# Patient Record
Sex: Female | Born: 1960 | Race: White | Hispanic: No | Marital: Married | Smoking: Never smoker
Health system: Southern US, Community
[De-identification: ages and names within clinical notes are randomized; demographics above are authoritative.]

## PROBLEM LIST (undated history)

## (undated) DIAGNOSIS — E669 Obesity, unspecified: Secondary | ICD-10-CM

## (undated) HISTORY — DX: Obesity, unspecified: E66.9

---

## 2003-09-11 HISTORY — PX: LAPAROSCOPIC VAGINAL HYSTERECTOMY WITH SALPINGO OOPHORECTOMY: SHX6681

## 2019-03-04 DIAGNOSIS — N905 Atrophy of vulva: Secondary | ICD-10-CM | POA: Insufficient documentation

## 2020-10-03 NOTE — Progress Notes (Signed)
    SUBJECTIVE:   CHIEF COMPLAINT / HPI:   60 yo female who presents to establish care. Previous Family Doctor: Langston Masker in [place], last seen 05/12/2020  Concerns: Chronic low back pain- been present for over two years, no inciting incident, feels more stiff in the mornings and when she lays down, but does not wake her from sleep. Played sports growing up but no history of trauma, used to lift a lot of heavy boxes. Denies fevers, chills, numbness, weakness, saddle paresthesia, bowel or bladder incontinence. Denies history of IV drug use. No radiation or symptoms of sciatica. More on right side sometimes, wondering if related to hips. No history of fractures or surgery on her back.  PMH: Chronic low back pain as above  Home Medications: None  Allergies: NKDA  Surgical Hx: Hysterectomy with bilateral oophorectomy- 2005 for abnormal bleeding, previously on Lupron, no history of cancer  Social Hx: Home: lives with husband Maisie Fus, and her dog! Children in area, babysits! Tobacco: Never smoker Alcohol use: doesn't use Other substance use: denies current or previous history of IV drug use or other substance use.  HCM Colonoscopy- 2018 Mammogram- 2021 Pap smear- 2021 Shingles vaccine- 2021 (records request signed today to verify)  OBJECTIVE:   BP 130/78   Pulse 65   Ht 5' 5.5" (1.664 m)   Wt 222 lb (100.7 kg)   SpO2 99%   BMI 36.38 kg/m   General: A&O, NAD HEENT: No sign of trauma, EOM grossly intact Cardiac: RRR, no m/r/g Respiratory: CTAB, normal WOB, no w/c/r GI: Soft, NTTP, non-distended  Extremities: NTTP, no peripheral edema. Spine: Lumbar and thoracic spine non-tender to palpation. No induration, erythema, or fluctuance appreciated along spine, no paraspinal musculature tenderness to palpation. No significant curvature or scoliosis appreciated. No skin lesions or rashes appreciated. MSK: negative straight leg raise bilaterally, negative FABER bilaterally. No tenderness  to palpation of bilateral hips, no pain with internal/external rotation of hips Neuro: Normal gait, moves all four extremities appropriately. Strength 5/5 in bilateral upper and lower extremities, sensation to light touch intact in bilateral upper and lower extremities Psych: Appropriate mood and affect   ASSESSMENT/PLAN:   Chronic low back pain - Lumbar X-ray ordered due to chronicity of complaint, received report that " Significant disc space narrowing at T11/T12, with prominent endplate sclerosis and significant anterior osteophyte formation. While the appearance could reflect an isolated level of significant spondylosis or sequela of previous trauma, please correlate with any clinical evidence of underlying infection. Further evaluation with MRI may be useful", also showing L4-L5 spondylosis - discussed with radiology, patient with normal neuro exam, no signs of infection/induration and no fevers or chills or recent worsening of symptoms, no history of IV drug use,  thus my suspicion for infectious cause very low, discussed with Ms Vermeer and decided with shared decision making to get MRI, radiology recommended without contrast - Have placed order, in mean time discussed strict return precautions with Ms Creelman including fevers, chills, worsening back pain, numbness or weakness - PRN Voltaren gel for pain control, referral to physical therapy   Called and discussed date/time for MRI appt on 10/10/20 with patient, answered all questions and concerns. Confirmed no indwelling device or known metal in body.  Billey Co, MD [facility]

## 2020-10-05 ENCOUNTER — Ambulatory Visit: Payer: 59 | Admitting: Family Medicine

## 2020-10-05 ENCOUNTER — Ambulatory Visit
Admission: RE | Admit: 2020-10-05 | Discharge: 2020-10-05 | Disposition: A | Discharge status: Home / Self Care | Payer: 59 | Source: Ambulatory Visit | Attending: Family Medicine | Admitting: Family Medicine

## 2020-10-05 ENCOUNTER — Other Ambulatory Visit: Payer: Self-pay

## 2020-10-05 ENCOUNTER — Encounter: Payer: Self-pay | Admitting: Family Medicine

## 2020-10-05 VITALS — BP 130/78 | HR 65 | Ht 65.5 in | Wt 222.0 lb

## 2020-10-05 DIAGNOSIS — M549 Dorsalgia, unspecified: Secondary | ICD-10-CM

## 2020-10-05 DIAGNOSIS — G8929 Other chronic pain: Secondary | ICD-10-CM

## 2020-10-05 DIAGNOSIS — M545 Low back pain, unspecified: Secondary | ICD-10-CM | POA: Diagnosis not present

## 2020-10-05 MED ORDER — DICLOFENAC SODIUM 1 % EX GEL: 4.0000 g | CUTANEOUS | 1 refills | Status: DC | PRN

## 2020-10-05 NOTE — Patient Instructions (Addendum)
It was wonderful to see you today.  Please bring ALL of your medications with you to every visit.   Today we talked about:  - Records requested from your previous doctor's office  - X-ray of your back today, you can go to [facility] either location for this and referral to physical therapy - Voltaren gel to your pharmacy  Thank you for choosing [facility].   Please call [phone] with any questions about today's appointment.  Please be sure to schedule follow up at the front  desk before you leave today.   Burley Saver, MD  Family Medicine

## 2020-10-06 ENCOUNTER — Encounter: Payer: Self-pay | Admitting: Family Medicine

## 2020-10-06 NOTE — Assessment & Plan Note (Addendum)
-   Lumbar X-ray ordered due to chronicity of complaint, received report that " Significant disc space narrowing at T11/T12, with prominent endplate sclerosis and significant anterior osteophyte formation. While the appearance could reflect an isolated level of significant spondylosis or sequela of previous trauma, please correlate with any clinical evidence of underlying infection. Further evaluation with MRI may be useful", also showing L4-L5 spondylosis - discussed with radiology, patient with normal neuro exam, no signs of infection/induration and no fevers or chills or recent worsening of symptoms, no history of IV drug use,  thus my suspicion for infectious cause very low, discussed with Kathleen Evans and decided with shared decision making to get MRI, radiology recommended without contrast - Have placed order, in mean time discussed strict return precautions with Kathleen Evans including fevers, chills, worsening back pain, numbness or weakness - PRN Voltaren gel for pain control, referral to physical therapy

## 2020-10-10 ENCOUNTER — Telehealth: Payer: Self-pay | Admitting: Family Medicine

## 2020-10-10 ENCOUNTER — Other Ambulatory Visit: Payer: Self-pay

## 2020-10-10 ENCOUNTER — Ambulatory Visit (HOSPITAL_COMMUNITY)
Admission: RE | Admit: 2020-10-10 | Discharge: 2020-10-10 | Disposition: A | Discharge status: Home / Self Care | Payer: 59 | Source: Ambulatory Visit | Attending: Family Medicine | Admitting: Family Medicine

## 2020-10-10 DIAGNOSIS — G8929 Other chronic pain: Secondary | ICD-10-CM

## 2020-10-10 DIAGNOSIS — M549 Dorsalgia, unspecified: Secondary | ICD-10-CM | POA: Diagnosis not present

## 2020-10-10 NOTE — Telephone Encounter (Signed)
Called and discussed MRI results with Kathleen Evans. Discussed chronic degenerative changes at T11-T12 without signs of acute infection. Discussed mild canal stenosis at L3-L4 and L4-L5. Discussed that disc at L4-L5 may contact the exited right L4 nerve root. Patient not having radicular symptoms at this time. Discussed that physical therapy remains first line for treatment, and can also use PRN tylenol and voltaren gel that has already been sent in. Referral to PT sent at this time. Answered all patient questions and concerns.  Burley Saver MD

## 2020-10-24 ENCOUNTER — Other Ambulatory Visit: Payer: Self-pay

## 2020-10-24 ENCOUNTER — Ambulatory Visit: Payer: 59 | Attending: Family Medicine

## 2020-10-24 DIAGNOSIS — G8929 Other chronic pain: Secondary | ICD-10-CM | POA: Diagnosis present

## 2020-10-24 DIAGNOSIS — M6281 Muscle weakness (generalized): Secondary | ICD-10-CM | POA: Diagnosis not present

## 2020-10-24 DIAGNOSIS — M545 Low back pain, unspecified: Secondary | ICD-10-CM | POA: Insufficient documentation

## 2020-10-24 DIAGNOSIS — M25652 Stiffness of left hip, not elsewhere classified: Secondary | ICD-10-CM | POA: Insufficient documentation

## 2020-10-24 NOTE — Therapy (Signed)
[facility] [address] [place], [place], [address] Phone: [phone]   Fax:  [phone]  Physical Therapy Evaluation  Patient Details  Name: Kathleen Evans MRN: [id number] Date of Birth: 11-Mar-1961 Referring Provider (PT): Pray   Encounter Date: 10/24/2020   PT End of Session - 10/24/20 1222    Visit Number 1    Number of Visits 17    Date for PT Re-Evaluation 12/19/20    PT Start Time 1015    PT Stop Time 1100    PT Time Calculation (min) 45 min    Activity Tolerance Patient tolerated treatment well    Behavior During Therapy [facility] for tasks assessed/performed           Past Medical History:  Diagnosis Date  . Obesity (BMI 35.0-39.9 without comorbidity)     Past Surgical History:  Procedure Laterality Date  . LAPAROSCOPIC VAGINAL HYSTERECTOMY WITH SALPINGO OOPHORECTOMY  2005    There were no vitals filed for this visit.    Subjective Assessment - 10/24/20 1016    Subjective Pt report LBP started slowly over time. Recently moved here from [place] where she used to do alot of activity. She has been feeling tight pain into left lower back more than right. Sometimes it bothers to lay on the left side, hard to get comfortable.    Limitations Sitting;Walking;Standing   completes with pain, alters way she is moving because of pain   How long can you stand comfortably? >1 hr but will tolerate discomfort    Patient Stated Goals to get stronger, to decrease pain    Currently in Pain? Yes    Pain Score 4    At worst 6-7/10   Pain Location Back    Pain Orientation Left    Pain Type Chronic pain    Pain Radiating Towards left buttocks    Pain Onset More than a month ago    Pain Frequency Constant    Aggravating Factors  worsened with standing up    Pain Relieving Factors tylenol, voltaren gel              [facility] PT Assessment - 10/24/20 1025      Assessment   Medical Diagnosis Chronic LBP     Referring Provider (PT) Pray    Hand Dominance Right    Next MD Visit Not yet    Prior Therapy None      Balance Screen   Has the patient fallen in the past 6 months No    Has the patient had a decrease in activity level because of a fear of falling?  No    Is the patient reluctant to leave their home because of a fear of falling?  No      Home Environment   Additional Comments 2 steps to get in, no issue. 1 level house.      Prior Function   Vocation Retired    [employer] work    Leisure Standing, walking, exercise with family at home      Cognition   Overall Cognitive Status Within Functional Limits for tasks assessed      AROM   AROM Assessment Site Lumbar    Lumbar Flexion 75%    Lumbar Extension 100%, no incr pain, "felt good"    Lumbar - Right Side Bend to lateral knee joint line    Lumbar - Left Side Bend Slightly above lateral knee joint line  Lumbar - Right Rotation 100%, + cramping LBP    Lumbar - Left Rotation 100%, + cramping LBP      Strength   Strength Assessment Site Hip;Knee;Ankle    Right/Left Hip Right;Left    Right Hip Flexion 4+/5    Right Hip Extension 4/5    Right Hip External Rotation  4+/5    Right Hip Internal Rotation 4/5    Right Hip ABduction 4+/5    Right Hip ADduction 4+/5    Left Hip Flexion 4/5    Left Hip Extension 4/5    Left Hip External Rotation 4-/5    Left Hip Internal Rotation 4-/5    Left Hip ABduction 4+/5    Left Hip ADduction 4+/5    Right/Left Knee Right;Left    Right Knee Flexion 4+/5    Right Knee Extension 4+/5    Left Knee Flexion 4/5    Left Knee Extension 4/5      Flexibility   Soft Tissue Assessment /Muscle Length yes    Hamstrings Mild tightness (L>R)    Piriformis mild tightness (L>R)      Palpation   Palpation comment TTP/mild spasm left  lumbar paraspinals, lower QL, SIJ and glute      Special Tests   Other special tests SLR negative Bilateral      Transfers   Five time sit to  stand comments  6 seconds- WFL for age. Noted quad bias with some functional glute weakness.      Ambulation/Gait   Gait Pattern Decreased stride length                      Objective measurements completed on examination: See above findings.               PT Education - 10/24/20 1222    Education Details Pt POC, Initial HEP. Access Code: [email or web address] . Seated Piriformis Stretch with Trunk Bend - 1 x daily - 5 x weekly - 4 sets - 30 seconds hold  Seated Hamstring Stretch - 1 x daily - 5 x weekly - 4 sets - 30 seconds hold  Supine Lower Trunk Rotation - 1 x daily - 5 x weekly - 2 sets - 10 reps  Supine March with Posterior Pelvic Tilt - 1 x daily - 5 x weekly - 2 sets - 10 reps  Supine Bridge - 1 x daily - 7 x weekly - 3 sets - 10 reps    Person(s) Educated Patient    Methods Demonstration;Explanation;Handout    Comprehension Verbalized understanding;Returned demonstration            PT Short Term Goals - 10/24/20 1231      PT SHORT TERM GOAL #1   Title Independent with initial HEP    Time 2    Period Weeks    Status New    Target Date 11/07/20             PT Long Term Goals - 10/24/20 1231      PT LONG TERM GOAL #1   Title Pt will report LBP </=2/10 with ADLs and walking.    Time 8    Period Weeks    Status New    Target Date 12/19/20      PT LONG TERM GOAL #2   Title Pt will demo gross BLE strength to at least 4+/5    Time 8    Period Weeks    Status New  Target Date 12/19/20      PT LONG TERM GOAL #3   Title Pt will demo full and symmetrical thoracolumbar rotation ROM without pain    Time 8    Period Weeks    Status New    Target Date 12/19/20                  Plan - 10/24/20 1223    Clinical Impression Statement Pt is a kind 60 yo female who presents with history of low back pain into left gluteal region. Pt had MRI of lumbar spine recently which noted Chronic degenerative changes at T11-T12 without signs of acute infection.  mild canal stenosis at L3-L4 and L4-L5. Pt presents with low back pain,  decreased lumbar and LE flexibility, and muscle weakness. She will benefit from skilled physical therapy to decrease pain, improve functional mobility and strength.    Personal Factors and Comorbidities Age;Past/Current Experience;Time since onset of injury/illness/exacerbation    Examination-Activity Limitations Locomotion Level    Examination-Participation Restrictions Community Activity;Interpersonal Relationship    Stability/Clinical Decision Making Stable/Uncomplicated    Clinical Decision Making Low    Rehab Potential Good    PT Frequency 2x / week    PT Duration 6 weeks   6-8 weeks   PT Treatment/Interventions ADLs/Self Care Home Management;Cryotherapy;Electrical Stimulation;Iontophoresis 4mg /ml Dexamethasone;Moist Heat;Traction;Gait training;Neuromuscular re-education;Balance training;Therapeutic exercise;Functional mobility training;Therapeutic activities;Manual techniques;Energy conservation;Taping    PT Next Visit Plan Follow up regarding HEP carryver and tolerance next visit. Lumbar and hip mobility. Progressive LE and proximal core strengthening as tolerated.    PT Home Exercise Plan see pt education.    Consulted and Agree with Plan of Care Patient           Patient will benefit from skilled therapeutic intervention in order to improve the following deficits and impairments:  Decreased range of motion,Postural dysfunction,Decreased strength,Decreased mobility,Pain,Improper body mechanics,Impaired flexibility  Visit Diagnosis: Muscle weakness (generalized) - Plan: PT plan of care cert/re-cert  Chronic low back pain, unspecified back pain laterality, unspecified whether sciatica present - Plan: PT plan of care cert/re-cert  Stiffness of left hip, not elsewhere classified - Plan: PT plan of care cert/re-cert     Problem List Patient Active Problem List   Diagnosis Date Noted  . Chronic low back pain  10/05/2020    10/07/2020, PT, DPT 10/24/2020, 12:38 PM  [facility] [address] [place], [place], [place] Phone: [phone]   Fax:  [phone]  Name: Cynde A. Bhola MRN: Judeth Cornfield Date of Birth: 1960/12/06

## 2020-10-31 ENCOUNTER — Other Ambulatory Visit: Payer: Self-pay

## 2020-10-31 ENCOUNTER — Ambulatory Visit: Payer: 59

## 2020-10-31 DIAGNOSIS — M6281 Muscle weakness (generalized): Secondary | ICD-10-CM

## 2020-10-31 DIAGNOSIS — G8929 Other chronic pain: Secondary | ICD-10-CM

## 2020-10-31 DIAGNOSIS — M25652 Stiffness of left hip, not elsewhere classified: Secondary | ICD-10-CM

## 2020-10-31 DIAGNOSIS — M545 Low back pain, unspecified: Secondary | ICD-10-CM

## 2020-10-31 NOTE — Therapy (Signed)
[facility] [address] [place], [place], [address] Phone: [phone]   Fax:  [phone]  Physical Therapy Treatment  Patient Details  Name: Kathleen Evans MRN: [id number] Date of Birth: 02-22-1961 Referring Provider (PT): Pray   Encounter Date: 10/31/2020   PT End of Session - 10/31/20 0939    Visit Number 2    Number of Visits 17    Date for PT Re-Evaluation 12/19/20    PT Start Time 0845    PT Stop Time 0930    PT Time Calculation (min) 45 min    Activity Tolerance Patient tolerated treatment well    Behavior During Therapy [facility] for tasks assessed/performed           Past Medical History:  Diagnosis Date  . Obesity (BMI 35.0-39.9 without comorbidity)     Past Surgical History:  Procedure Laterality Date  . LAPAROSCOPIC VAGINAL HYSTERECTOMY WITH SALPINGO OOPHORECTOMY  2005    There were no vitals filed for this visit.   Subjective Assessment - 10/31/20 0903    Subjective Pt reports not feeling as siff since starting exercises at home    Limitations Sitting;Walking;Standing    How long can you stand comfortably? >1 hr but will tolerate discomfort    Patient Stated Goals to get stronger, to decrease pain    Currently in Pain? No/denies    Pain Score 0-No pain                             [facility] Adult PT Treatment/Exercise - 10/31/20 0850      Exercises   Exercises Lumbar;Knee/Hip;Ankle      Lumbar Exercises: Stretches   Active Hamstring Stretch 1 rep;20 seconds    Single Knee to Chest Stretch 2 reps;10 seconds;Left;Right    Lower Trunk Rotation --   10 x on mat, 10x with pball   ITB Stretch 2 reps;Left;20 seconds    Piriformis Stretch  Lumbar 3 way stretch 2 reps;20 seconds   Blue pball x 10 each direction     Lumbar Exercises: Standing   Other Standing Lumbar Exercises Hip hinge/modified dead lift with 10# DB x 10, cues to prevent knee hyperext and for hip hinge.     Other Standing Lumbar Exercises hip hinge/cable pull throughs 15# x 10      Lumbar Exercises: Seated   Sit to Stand --   3 x 10 with yellow med ball   Other Seated Lumbar Exercises Trunk rotation with red pball taps to cones 10 x 2 alternating      Lumbar Exercises: Supine   Bridge Limitations Bridge with PPT 10 x 2, Bridge with red band ABD x10    Basic Lumbar Stabilization Limitations Supine marches 10 x 3    Other Supine Lumbar Exercises Red pball transfers with symmetrical deadbug x 5, emphasis on slow controlled motion      Manual Therapy   Manual Therapy ;Passive ROM    Manual therapy comments manual LEFT piriformis, ITB stretching                    PT Short Term Goals - 10/24/20 1231      PT SHORT TERM GOAL #1   Title Independent with initial HEP    Time 2    Period Weeks    Status New    Target Date 11/07/20  PT Long Term Goals - 10/24/20 1231      PT LONG TERM GOAL #1   Title Pt will report LBP </=2/10 with ADLs and walking.    Time 8    Period Weeks    Status New    Target Date 12/19/20      PT LONG TERM GOAL #2   Title Pt will demo gross BLE strength to at least 4+/5    Time 8    Period Weeks    Status New    Target Date 12/19/20      PT LONG TERM GOAL #3   Title Pt will demo full and symmetrical thoracolumbar rotation ROM without pain    Time 8    Period Weeks    Status New    Target Date 12/19/20                 Plan - 10/31/20 0939    Clinical Impression Statement Pt demo good carryover and performance of HEP with minimal cues. She reports decreased stiffness felt since starting stretches. Tolerated progression of strength and mobility exercises well, although with cueing needed for hip hinging exercises to prevent trunk compensations or knee hyperextension. Initiated supine core stabilization with pball passes between UE/LE, not as much success with verbal cues for abdominal engagement but much improved with tactile  cue of PT hand btween her back and the mat table. Discussed addition of supine ITB stretch with strap/dog leash for the home for LEFT leg tightness and she was in agreement.  She will continue benefit from porgression in trunk and LE mobility, lumbar stabilization, and general strengthening as tolerated.    Personal Factors and Comorbidities Age;Past/Current Experience;Time since onset of injury/illness/exacerbation    Examination-Activity Limitations Locomotion Level    Examination-Participation Restrictions Community Activity;Interpersonal Relationship    Rehab Potential Good    PT Frequency 2x / week    PT Duration --   6-8 weeks   PT Treatment/Interventions ADLs/Self Care Home Management;Cryotherapy;Electrical Stimulation;Iontophoresis 4mg /ml Dexamethasone;Moist Heat;Traction;Gait training;Neuromuscular re-education;Balance training;Therapeutic exercise;Functional mobility training;Therapeutic activities;Manual techniques;Energy conservation;Taping    PT Next Visit Plan Lumbar and hip mobility. Progressive LE and proximal core strengthening as tolerated.    Consulted and Agree with Plan of Care Patient           Patient will benefit from skilled therapeutic intervention in order to improve the following deficits and impairments:  Decreased range of motion,Postural dysfunction,Decreased strength,Decreased mobility,Pain,Improper body mechanics,Impaired flexibility  Visit Diagnosis: Muscle weakness (generalized)  Chronic low back pain, unspecified back pain laterality, unspecified whether sciatica present  Stiffness of left hip, not elsewhere classified     Problem List Patient Active Problem List   Diagnosis Date Noted  . Chronic low back pain 10/05/2020    10/07/2020, PT, DPT 10/31/2020, 9:45 AM  [facility] [address] [place], [place], [place] Phone: [phone]   Fax:  [phone]  Name: Kathleen A.  Evans MRN: Judeth Cornfield Date of Birth: 1960-11-17

## 2020-11-02 ENCOUNTER — Other Ambulatory Visit: Payer: Self-pay

## 2020-11-02 ENCOUNTER — Ambulatory Visit: Payer: 59

## 2020-11-02 DIAGNOSIS — M6281 Muscle weakness (generalized): Secondary | ICD-10-CM | POA: Diagnosis not present

## 2020-11-02 DIAGNOSIS — M25652 Stiffness of left hip, not elsewhere classified: Secondary | ICD-10-CM

## 2020-11-02 DIAGNOSIS — M545 Low back pain, unspecified: Secondary | ICD-10-CM

## 2020-11-02 NOTE — Therapy (Signed)
[facility] [address] [place], [place], [address] Phone: [phone]   Fax:  [phone]  Physical Therapy Treatment  Patient Details  Name: Kathleen Evans MRN: [id number] Date of Birth: [birth year] Referring Provider (PT): Pray   Encounter Date: 11/02/2020   PT End of Session - 11/02/20 0851    Visit Number 3    Number of Visits 17    Date for PT Re-Evaluation 12/19/20    PT Start Time 0845    PT Stop Time 0930    PT Time Calculation (min) 45 min    Activity Tolerance Patient tolerated treatment well    Behavior During Therapy [facility] for tasks assessed/performed           Past Medical History:  Diagnosis Date  . Obesity (BMI 35.0-39.9 without comorbidity)     Past Surgical History:  Procedure Laterality Date  . LAPAROSCOPIC VAGINAL HYSTERECTOMY WITH SALPINGO OOPHORECTOMY  2005    There were no vitals filed for this visit.   Subjective Assessment - 11/02/20 0847    Subjective Exercises going well at home. Felt good after last PT session. Was a bit stiff yesterday after doing yardwork at home but doing okay right now. Will need to change appointment times because of family schedule, needs to be available to care for grandkids    Limitations Sitting;Walking;Standing    How long can you stand comfortably? >1 hr but will tolerate discomfort    Patient Stated Goals to get stronger, to decrease pain    Currently in Pain? No/denies    Pain Score 0-No pain                             [facility] Adult PT Treatment/Exercise - 11/02/20 0001      Lumbar Exercises: Stretches   Active Hamstring Stretch 2 reps;20 seconds;Right;Left    ITB Stretch 2 reps;Left;20 seconds;Right   Standing with overhead reach   Piriformis Stretch 2 reps;20 seconds;Right;Left    Other Lumbar Stretch Exercise Standing open book against wall x10 B. Seated cat/camel x 5, QP cat camel x 10, QP thread the needle rotation x5 each  side      Lumbar Exercises: Aerobic   Recumbent Bike L1.5 x 7 minutes      Lumbar Exercises: Standing   Functional Squats --   Goblet squat 5# x 10     Lumbar Exercises: Seated   Other Seated Lumbar Exercises seated pelvic clocks on dynadisc x 10 CW/ CCW      Lumbar Exercises: Supine   Bridge Limitations Bridge with red band ABD x10    Basic Lumbar Stabilization Limitations Supine marches 10x2, supine marches from 90 deg hip flex x10, Supine dead bugs with knees bent x10                  PT Education - 11/02/20 1029    Education Details Initial HEP updated: Access Code: [email or web address] . Seated Piriformis Stretch with Trunk Bend - 1 x daily - 5 x weekly - 4 sets - 30 seconds hold  Seated Hamstring Stretch - 1 x daily - 5 x weekly - 4 sets - 30 seconds hold  Cat-Camel - 1 x daily - 7 x weekly - 2 sets - 10 reps - 3 econd holds hold  Quadruped Full Range Thoracic Rotation with Reach - 1 x daily - 7 x weekly - 2 sets - 10 reps -  3 second hold  Supine Bridge with Resistance Band - 1 x daily - 7 x weekly - 2 sets - 10 reps  Dead Bug - 1 x daily - 7 x weekly - 2 sets - 10 reps  Standing ITB Stretch - 1 x daily - 7 x weekly - 4 sets - 30 second hold    Person(s) Educated Patient    Methods Explanation;Demonstration;Handout    Comprehension Verbalized understanding;Returned demonstration            PT Short Term Goals - 11/02/20 0851      PT SHORT TERM GOAL #1   Title Independent with initial HEP    Time 2    Period Weeks    Status Achieved   11/02/20   Target Date 11/07/20             PT Long Term Goals - 10/24/20 1231      PT LONG TERM GOAL #1   Title Pt will report LBP </=2/10 with ADLs and walking.    Time 8    Period Weeks    Status New    Target Date 12/19/20      PT LONG TERM GOAL #2   Title Pt will demo gross BLE strength to at least 4+/5    Time 8    Period Weeks    Status New    Target Date 12/19/20      PT LONG TERM GOAL #3   Title Pt will demo full and  symmetrical thoracolumbar rotation ROM without pain    Time 8    Period Weeks    Status New    Target Date 12/19/20                 Plan - 11/02/20 1030    Clinical Impression Statement Pt continues to demo excellent carryover of initial HEP meeting STG, initial HEP updated with excellent tolerance and return demonstration without pain exacerbation. QP mobility exercises slighlty challenging but completed nicely, added to HEP to address spinal hypombility and stiffness. Pt report neding to change PT appointments due to [facility] scheduling and needing to take care of grandchildren, discussed potential decrease to 1x/week with bulkier HEP as needed  to work towards goals. Pt to check schedule availability for follow up sessions.    Personal Factors and Comorbidities Age;Past/Current Experience;Time since onset of injury/illness/exacerbation    Examination-Activity Limitations Locomotion Level    Examination-Participation Restrictions Community Activity;Interpersonal Relationship    Rehab Potential Good    PT Frequency 2x / week    PT Duration --   6-8 weeks   PT Treatment/Interventions ADLs/Self Care Home Management;Cryotherapy;Electrical Stimulation;Iontophoresis 4mg /ml Dexamethasone;Moist Heat;Traction;Gait training;Neuromuscular re-education;Balance training;Therapeutic exercise;Functional mobility training;Therapeutic activities;Manual techniques;Energy conservation;Taping    PT Next Visit Plan follow up on updated HEP tolerance. Lumbar and hip mobility. Progressive LE and proximal core strengthening as tolerated.    PT Home Exercise Plan see pt education.    Consulted and Agree with Plan of Care Patient           Patient will benefit from skilled therapeutic intervention in order to improve the following deficits and impairments:  Decreased range of motion,Postural dysfunction,Decreased strength,Decreased mobility,Pain,Improper body mechanics,Impaired flexibility  Visit  Diagnosis: Muscle weakness (generalized)  Chronic low back pain, unspecified back pain laterality, unspecified whether sciatica present  Stiffness of left hip, not elsewhere classified     Problem List Patient Active Problem List   Diagnosis Date Noted  . Chronic low back pain  10/05/2020    Anson Crofts , PT, DPT 11/02/2020, 10:58 AM  [facility] [address] [place], [place], [address] Phone: [phone]   Fax:  [phone]  Name: Samarrah A. Munn MRN: [id number] Date of Birth: 06-09-1961

## 2020-11-09 ENCOUNTER — Encounter: Payer: 59 | Admitting: Physical Therapy

## 2020-11-29 ENCOUNTER — Other Ambulatory Visit: Payer: Self-pay | Admitting: Family Medicine

## 2020-11-29 DIAGNOSIS — M549 Dorsalgia, unspecified: Secondary | ICD-10-CM

## 2021-06-05 ENCOUNTER — Other Ambulatory Visit: Payer: Self-pay

## 2021-06-05 ENCOUNTER — Encounter: Payer: Self-pay | Admitting: Family Medicine

## 2021-06-05 ENCOUNTER — Ambulatory Visit: Payer: 59 | Admitting: Family Medicine

## 2021-06-05 VITALS — BP 126/76 | HR 58 | Ht 66.0 in | Wt 224.0 lb

## 2021-06-05 DIAGNOSIS — Z23 Encounter for immunization: Secondary | ICD-10-CM | POA: Diagnosis not present

## 2021-06-05 DIAGNOSIS — Z1231 Encounter for screening mammogram for malignant neoplasm of breast: Secondary | ICD-10-CM | POA: Diagnosis not present

## 2021-06-05 DIAGNOSIS — M722 Plantar fascial fibromatosis: Secondary | ICD-10-CM

## 2021-06-05 NOTE — Patient Instructions (Signed)
It was wonderful to see you today.  Please bring ALL of your medications with you to every visit.   Today we talked about:  - Taking ibuprofen 400mg  three times a day with meals for 5 days - Do attached stretches, get a pair of shoes for in the house, and heel cups to place in shoes from any store ([employer], [employer], etc) - Call me in 1-2 weeks if not improving - Call to schedule your mammogram   Thank you for choosing [facility].   Please call [phone] with any questions about today's appointment.  Please be sure to schedule follow up at the front  desk before you leave today.   [phone], MD  Family Medicine

## 2021-06-05 NOTE — Assessment & Plan Note (Addendum)
-   discussed scheduled NSAIDs x5 days, 400mg  ibuprofen TIDWM, as well as stretches, icing, heel cups and getting pair of supportive indoor shoes - discussed to call clinic if symptoms not improving in 1-2 weeks with conservative measures, can consider sports med referral for potential injection at that time

## 2021-06-05 NOTE — Progress Notes (Signed)
    SUBJECTIVE:   CHIEF COMPLAINT / HPI:   Right heel pain- started six weeks ago, right at base of heel, mostly with walking, never had before. No accident or trauma. Does walk barefoot in house or in socks often, moved recently and new house has hardwood floors. No previous surgery to foot. No swelling or skin changes appreciated.  PERTINENT  PMH / PSH: chronic back pain  OBJECTIVE:   BP 126/76   Pulse (!) 58   Ht 5\' 6"  (1.676 m)   Wt 224 lb (101.6 kg)   SpO2 96%   BMI 36.15 kg/m   General: alert & oriented, no apparent distress, well groomed HEENT: normocephalic, atraumatic, EOM grossly intact, oral mucosa moist, neck supple Respiratory: normal respiratory effort GI: non-distended Skin: no rashes, no jaundice Psych: appropriate mood and affect  Right foot: tender to palpation over heel based and arch of foot, no malleolar tenderness, no base of fifth metatarsal tenderness, 2+ dorsalis pedis and posterior tibial pulses, normal cap refill, no skin erythema or rash, no swelling appreciated  ASSESSMENT/PLAN:   Plantar fasciitis of right foot - discussed scheduled NSAIDs x5 days, 400mg  ibuprofen TIDWM, as well as stretches, icing, heel cups and getting pair of supportive indoor shoes - discussed to call clinic if symptoms not improving in 1-2 weeks with conservative measures, can consider sports med referral for potential injection at that time   Mammogram ordered today, given number to call and schedule.  Received flu vaccine today.   , MD [facility]

## 2021-07-07 ENCOUNTER — Other Ambulatory Visit: Payer: Self-pay

## 2021-07-07 ENCOUNTER — Ambulatory Visit
Admission: RE | Admit: 2021-07-07 | Discharge: 2021-07-07 | Disposition: A | Discharge status: Home / Self Care | Payer: 59 | Source: Ambulatory Visit | Attending: Family Medicine | Admitting: Family Medicine

## 2021-07-07 DIAGNOSIS — Z1231 Encounter for screening mammogram for malignant neoplasm of breast: Secondary | ICD-10-CM

## 2021-07-27 ENCOUNTER — Other Ambulatory Visit: Payer: Self-pay | Admitting: Family Medicine

## 2021-07-27 DIAGNOSIS — R928 Other abnormal and inconclusive findings on diagnostic imaging of breast: Secondary | ICD-10-CM

## 2021-08-02 ENCOUNTER — Other Ambulatory Visit: Payer: Self-pay | Admitting: Family Medicine

## 2021-08-02 DIAGNOSIS — R928 Other abnormal and inconclusive findings on diagnostic imaging of breast: Secondary | ICD-10-CM

## 2021-08-07 ENCOUNTER — Other Ambulatory Visit: Payer: Self-pay | Admitting: Family Medicine

## 2021-08-07 DIAGNOSIS — Z1231 Encounter for screening mammogram for malignant neoplasm of breast: Secondary | ICD-10-CM

## 2021-09-06 ENCOUNTER — Other Ambulatory Visit: Payer: 59

## 2021-09-23 ENCOUNTER — Encounter: Payer: Self-pay | Admitting: Family Medicine

## 2021-12-20 ENCOUNTER — Telehealth: Payer: Self-pay | Admitting: Family Medicine

## 2021-12-20 ENCOUNTER — Ambulatory Visit: Payer: 59 | Admitting: Family Medicine

## 2021-12-20 ENCOUNTER — Encounter: Payer: Self-pay | Admitting: Family Medicine

## 2021-12-20 VITALS — BP 138/76 | HR 73 | Ht 66.0 in | Wt 232.0 lb

## 2021-12-20 DIAGNOSIS — Z1211 Encounter for screening for malignant neoplasm of colon: Secondary | ICD-10-CM

## 2021-12-20 DIAGNOSIS — H9319 Tinnitus, unspecified ear: Secondary | ICD-10-CM | POA: Insufficient documentation

## 2021-12-20 NOTE — Assessment & Plan Note (Signed)
Normal TM on exam, no ototoxic mediations, referral to ENT ?

## 2021-12-20 NOTE — Progress Notes (Signed)
? ? ?  SUBJECTIVE:  ? ?CHIEF COMPLAINT / HPI:  ? ?Unilateral tinnitus- x 1 month. No headaches, vision changes, ear pain/fullness, history of head trauma, hearing loss. Had a tooth removed in Jan and wondering if could be related. It is constant, non-pulsatile. No history of ear infection or ear tubes/surgery. ? ?Requesting referral to [facility] for screening colonoscopy. ? ?Questions about GLP-1 and weight loss. ? ?PERTINENT  PMH / PSH: plantar fascitis, chronic back pain ? ?OBJECTIVE:  ? ?BP 138/76   Pulse 73   Ht 5\' 6"  (1.676 m)   Wt 232 lb (105.2 kg)   SpO2 98%   BMI 37.45 kg/m?   ?General: A&O, NAD ?HEENT: No sign of trauma, EOM grossly intact, bilat TM clear without erythema ?Cardiac: RRR, no m/r/g ?Respiratory: CTAB, normal WOB, no w/c/r ?GI: Soft, NTTP, non-distended  ?Extremities: NTTP, no peripheral edema. ?Neuro: Normal gait, moves all four extremities appropriately. ?Psych: Appropriate mood and affect ? ? ?ASSESSMENT/PLAN:  ? ?Unilateral subjective nonpulsatile tinnitus without hearing loss, otoscopic finding, neurologic deficit, or head trauma ?Normal TM on exam, no ototoxic mediations, referral to ENT ?  ?Discussed weight loss, will call if wanting to start med or referralto [facility]. ? ?Referred to GI. ? ? , MD ?[facility]  ? ?

## 2021-12-20 NOTE — Telephone Encounter (Signed)
Patient wants to get a referral for a colonoscopy, if possible she would like to be referred to [facility]. ?

## 2021-12-20 NOTE — Patient Instructions (Signed)
It was wonderful to see you today. ? ?Please bring ALL of your medications with you to every visit.  ? ?Today we talked about: ? ?- I have referred you to ENT for a hearing test ?- If you decide you want to go see [facility] please let me know ? ? ?Thank you for choosing [facility].  ? ?Please call [phone] with any questions about today's appointment. ? ?Please be sure to schedule follow up at the front  desk before you leave today.  ? ?Please arrive at least 15 minutes prior to your scheduled appointments. ?  ?If you had blood work today, I will send you a MyChart message or a letter if results are normal. Otherwise, I will give you a call. ?  ?If you had a referral placed, they will call you to set up an appointment. Please give [place] a call if you don't hear back in the next 2 weeks. ?  ?If you need additional refills before your next appointment, please call your pharmacy first.  ? ?Burley Saver, MD  ?Family Medicine   ?

## 2021-12-25 NOTE — Telephone Encounter (Signed)
Pt informed that referral has been placed and that [facility] should reach out to her and get that scheduled. Kathleen Evans, CMA ? ?

## 2022-02-13 ENCOUNTER — Encounter: Payer: Self-pay | Admitting: *Deleted

## 2022-07-09 ENCOUNTER — Ambulatory Visit: Payer: 59

## 2022-07-12 ENCOUNTER — Ambulatory Visit
Admission: RE | Admit: 2022-07-12 | Discharge: 2022-07-12 | Disposition: A | Discharge status: Home / Self Care | Payer: 59 | Source: Ambulatory Visit | Attending: Family Medicine | Admitting: Family Medicine

## 2022-07-12 DIAGNOSIS — Z1231 Encounter for screening mammogram for malignant neoplasm of breast: Secondary | ICD-10-CM

## 2022-10-03 ENCOUNTER — Other Ambulatory Visit: Payer: Self-pay | Admitting: Family Medicine

## 2022-10-03 DIAGNOSIS — Z1231 Encounter for screening mammogram for malignant neoplasm of breast: Secondary | ICD-10-CM

## 2022-12-21 ENCOUNTER — Encounter: Payer: Self-pay | Admitting: Family Medicine

## 2022-12-21 ENCOUNTER — Ambulatory Visit: Payer: 59 | Admitting: Family Medicine

## 2022-12-21 VITALS — BP 122/76 | HR 78 | Ht 66.0 in | Wt 227.0 lb

## 2022-12-21 DIAGNOSIS — Z1159 Encounter for screening for other viral diseases: Secondary | ICD-10-CM | POA: Diagnosis not present

## 2022-12-21 DIAGNOSIS — G8929 Other chronic pain: Secondary | ICD-10-CM

## 2022-12-21 DIAGNOSIS — R3589 Other polyuria: Secondary | ICD-10-CM | POA: Diagnosis not present

## 2022-12-21 DIAGNOSIS — M545 Low back pain, unspecified: Secondary | ICD-10-CM

## 2022-12-21 DIAGNOSIS — Z114 Encounter for screening for human immunodeficiency virus [HIV]: Secondary | ICD-10-CM | POA: Diagnosis not present

## 2022-12-21 MED ORDER — MELOXICAM 7.5 MG PO TABS: 7.5000 mg | ORAL_TABLET | Freq: Every day | ORAL | 1 refills | Status: DC

## 2022-12-21 NOTE — Assessment & Plan Note (Signed)
No red flag signs or symptoms, has tried PT in the past and didn't help Discussed trial of low dose meloxicam, discuss risk of GERD and PUD, and symptoms to watch for If improved consider use longer term

## 2022-12-21 NOTE — Assessment & Plan Note (Signed)
Hep C testing today 

## 2022-12-21 NOTE — Patient Instructions (Addendum)
It was wonderful to see you today.  Please bring ALL of your medications with you to every visit.   Today we talked about:  [facility] [address] [place], [place] [address] [phone]  For your back pain- we are trying meloxicam 7.5 mg daily. If you have heartburn or stomach pain with this stop it and let me know.  Try taking the melatonin when the sun sets, and stop drinking water after dinnertime and we can see if this affects your urinary symptoms.  We will check lab work for diabetes, cholesterol, and your kidney function today.  Thank you for choosing [facility].   Please call [phone] with any questions about today's appointment.  Please arrive at least 15 minutes prior to your scheduled appointments.   If you had blood work today, I will send you a MyChart message or a letter if results are normal. Otherwise, I will give you a call.   If you had a referral placed, they will call you to set up an appointment. Please give [place] a call if you don't hear back in the next 2 weeks.   If you need additional refills before your next appointment, please call your pharmacy first.   Burley Saver, MD  Family Medicine

## 2022-12-21 NOTE — Assessment & Plan Note (Signed)
Checking A1c and BMP today Discussed decreasing drinking after dinner to help decrease polyuria

## 2022-12-21 NOTE — Assessment & Plan Note (Signed)
HIV ordered today

## 2022-12-21 NOTE — Progress Notes (Signed)
    SUBJECTIVE:   CHIEF COMPLAINT / HPI:   Low back pain- previous MRI with degenerative changes at T11-T12, mild canal stenosis L4-L5 with some disc protrusion. Has been feeling more stiff. No bowel or bladder incontinence, no saddle paresthesias, no numbness or weakness. Not taking medications for it. Tried PT in the past and made it worse.  Multiple family members with recent stroke, wondering what she can do to prevent this and if she needs other lab work at this time.  Frequent urination- x2 at night. And multiple times throughout the day. Has limited her caffeine to just tea in the AM, cut out sodas. Does drink water up to bedtime. No urgency or urge or stress incontinence, just peeing frequently. Would like to be checked for diabetes. NO dysuria.  Last pap 2021- would like referral to OB/GYN  PERTINENT  PMH / PSH: chronic low back pain  OBJECTIVE:   BP 122/76   Pulse 78   Ht 5\' 6"  (1.676 m)   Wt 227 lb (103 kg)   SpO2 99%   BMI 36.64 kg/m   General: alert & oriented, no apparent distress, well groomed HEENT: normocephalic, atraumatic, EOM grossly intact, oral mucosa moist, neck supple Respiratory: normal respiratory effort GI: non-distended MSK: lumbar spine without erythema, induration, no palpable step offs, left lumbar paraspinal musculature TTP, normal strengths in legs bilaterally Skin: no rashes, no jaundice Psych: appropriate mood and affect    ASSESSMENT/PLAN:   Chronic low back pain No red flag signs or symptoms, has tried PT in the past and didn't help Discussed trial of low dose meloxicam, discuss risk of GERD and PUD, and symptoms to watch for If improved consider use longer term  Polyuria Checking A1c and BMP today Discussed decreasing drinking after dinner to help decrease polyuria  Screening for HIV (human immunodeficiency virus) HIV ordered today  Need for hepatitis C screening test Hep C testing today   Lipid panel checked today.  Billey Co, MD [facility]

## 2022-12-25 LAB — BASIC METABOLIC PANEL
BUN/Creatinine Ratio: 21 (ref 12–28)
BUN: 17 mg/dL (ref 8–27)
CO2: 22 mmol/L (ref 20–29)
Calcium: 9.3 mg/dL (ref 8.7–10.3)
Chloride: 106 mmol/L (ref 96–106)
Creatinine, Ser: 0.81 mg/dL (ref 0.57–1.00)
Glucose: 88 mg/dL (ref 70–99)
Potassium: 4.9 mmol/L (ref 3.5–5.2)
Sodium: 142 mmol/L (ref 134–144)
eGFR: 83 mL/min/{1.73_m2} (ref >59)

## 2022-12-25 LAB — LIPID PANEL
Chol/HDL Ratio: 3.1 ratio (ref 0.0–4.4)
Cholesterol, Total: 139 mg/dL (ref 100–199)
HDL: 45 mg/dL (ref >39)
LDL Chol Calc (NIH): 81 mg/dL (ref 0–99)
Triglycerides: 63 mg/dL (ref 0–149)
VLDL Cholesterol Cal: 13 mg/dL (ref 5–40)

## 2022-12-25 LAB — HCV AB W REFLEX TO QUANT PCR: HCV Ab: NONREACTIVE

## 2022-12-25 LAB — HIV ANTIBODY (ROUTINE TESTING W REFLEX): HIV Screen 4th Generation wRfx: NONREACTIVE

## 2022-12-25 LAB — HEMOGLOBIN A1C
Est. average glucose Bld gHb Est-mCnc: 126 mg/dL
Hgb A1c MFr Bld: 6 % — ABNORMAL HIGH (ref 4.8–5.6)

## 2022-12-25 LAB — HCV INTERPRETATION

## 2023-02-15 ENCOUNTER — Other Ambulatory Visit: Payer: Self-pay | Admitting: Family Medicine

## 2023-02-15 DIAGNOSIS — M545 Low back pain, unspecified: Secondary | ICD-10-CM

## 2023-03-13 ENCOUNTER — Other Ambulatory Visit: Payer: Self-pay | Admitting: Family Medicine

## 2023-03-13 DIAGNOSIS — M545 Low back pain, unspecified: Secondary | ICD-10-CM

## 2023-04-08 ENCOUNTER — Other Ambulatory Visit: Payer: Self-pay | Admitting: Family Medicine

## 2023-04-08 DIAGNOSIS — M545 Low back pain, unspecified: Secondary | ICD-10-CM

## 2023-04-20 IMAGING — MG MM DIGITAL SCREENING BILAT W/ TOMO AND CAD
8 series · 8 of 24 positions shown · non-contrast
Comparison: None.
COMPARISON: None.

Addendum:
CLINICAL DATA: Screening.

EXAM:
DIGITAL SCREENING BILATERAL MAMMOGRAM WITH TOMOSYNTHESIS AND CAD
TECHNIQUE: Bilateral screening digital craniocaudal and mediolateral oblique
mammograms were obtained. Bilateral screening digital breast
tomosynthesis was performed. The images were evaluated with
computer-aided detection.

[L CC synth-2D]
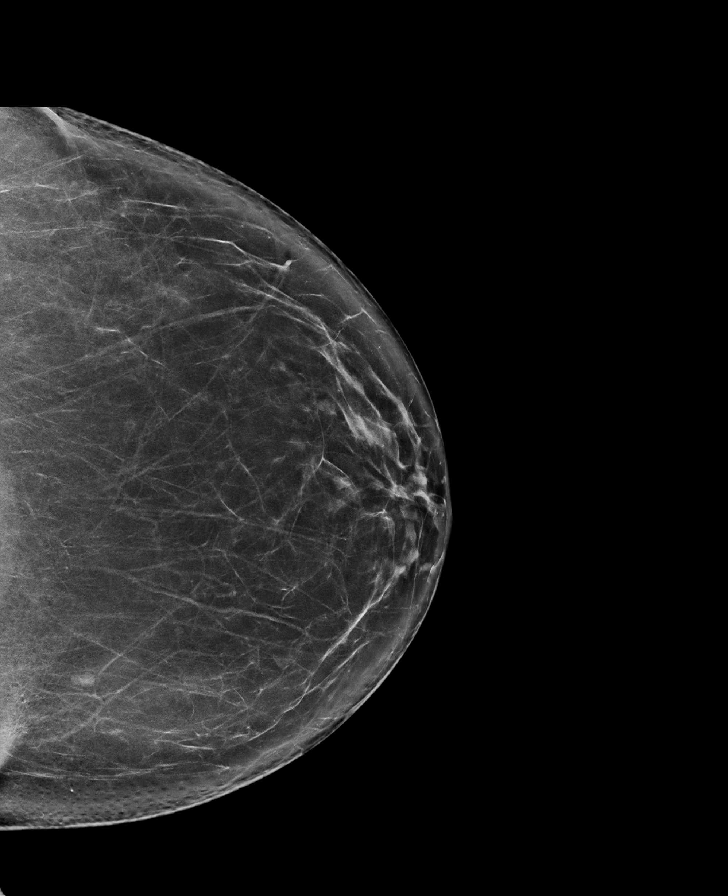

[R MLO synth-2D]
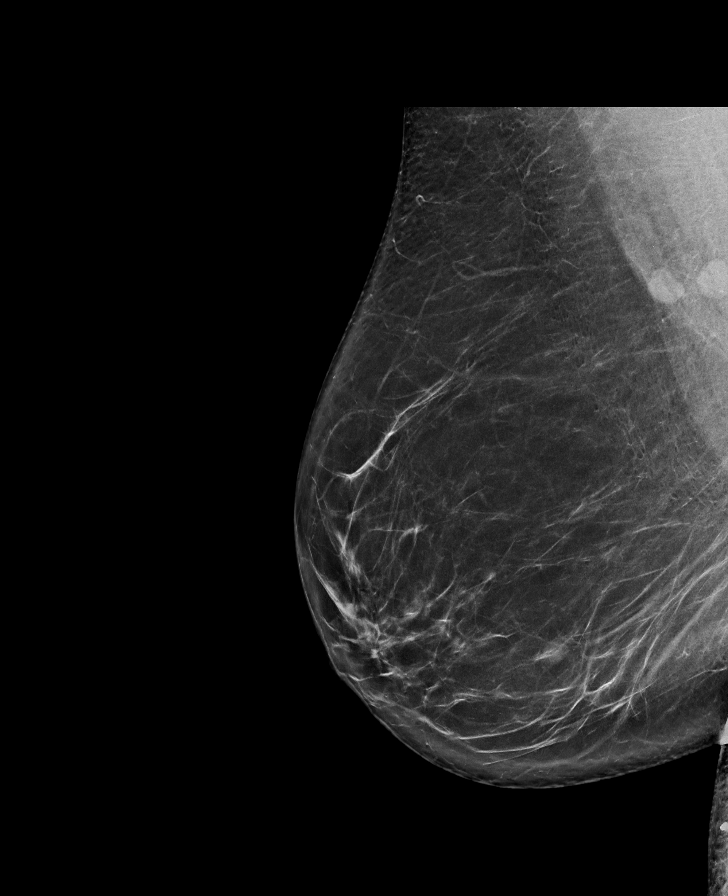

[L MLO synth-2D]
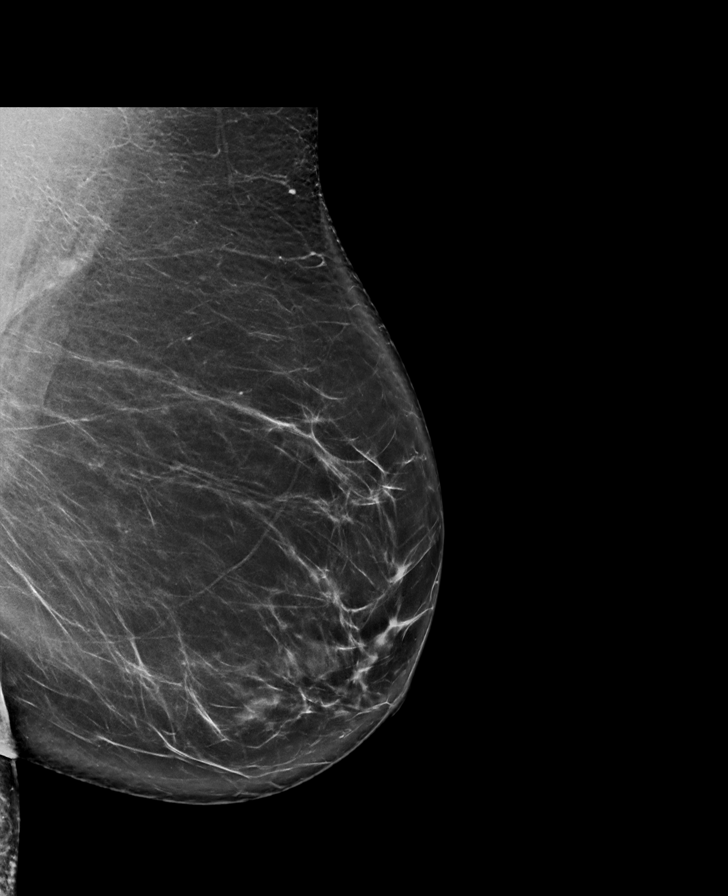

[R CC synth-2D]
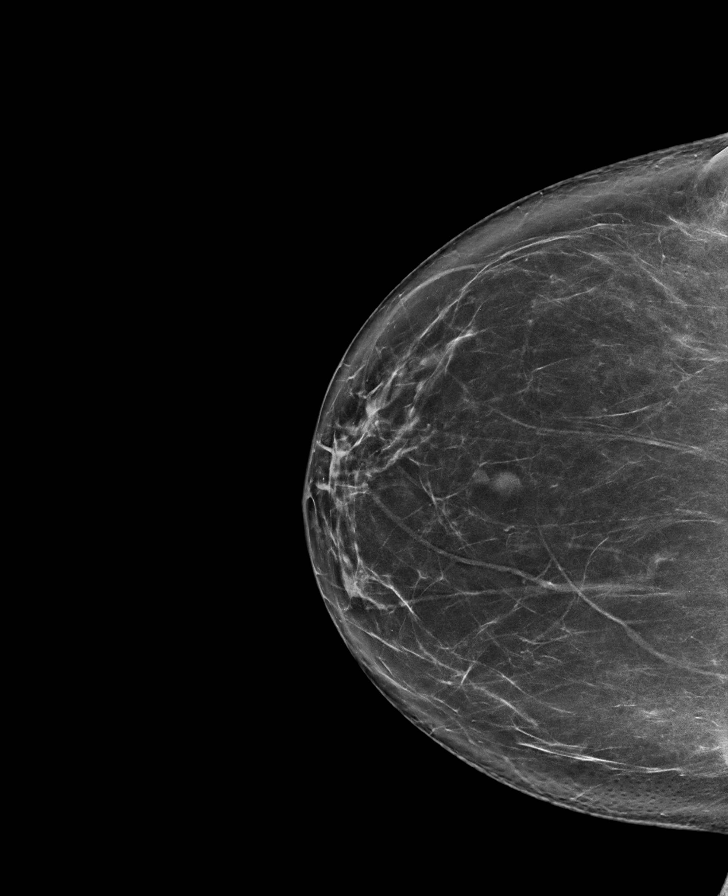

[R MLO tomo · tomo slice 46/91.0]
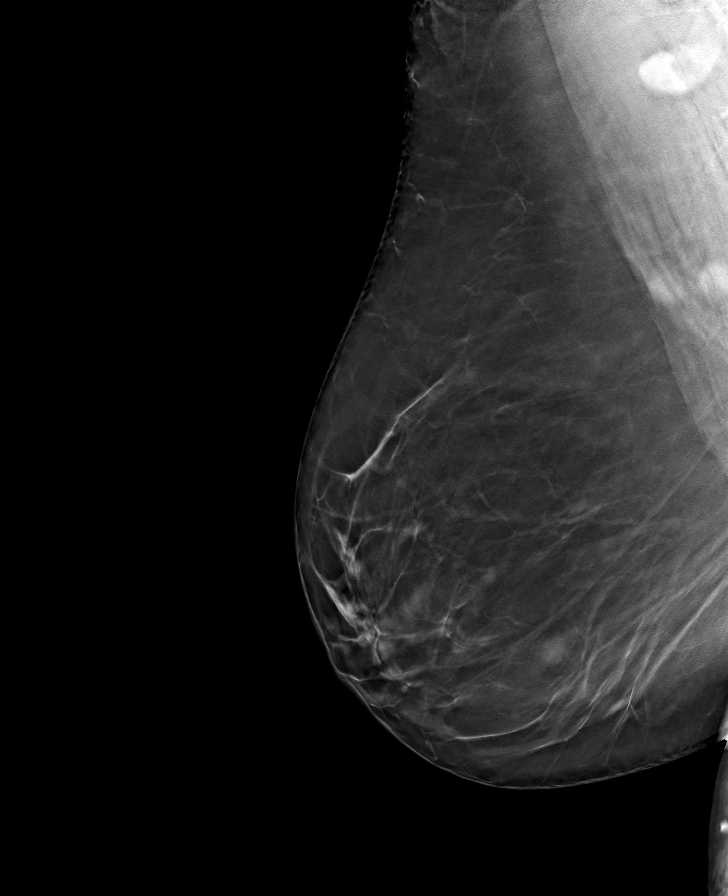

[L MLO tomo · tomo slice 45/90.0]
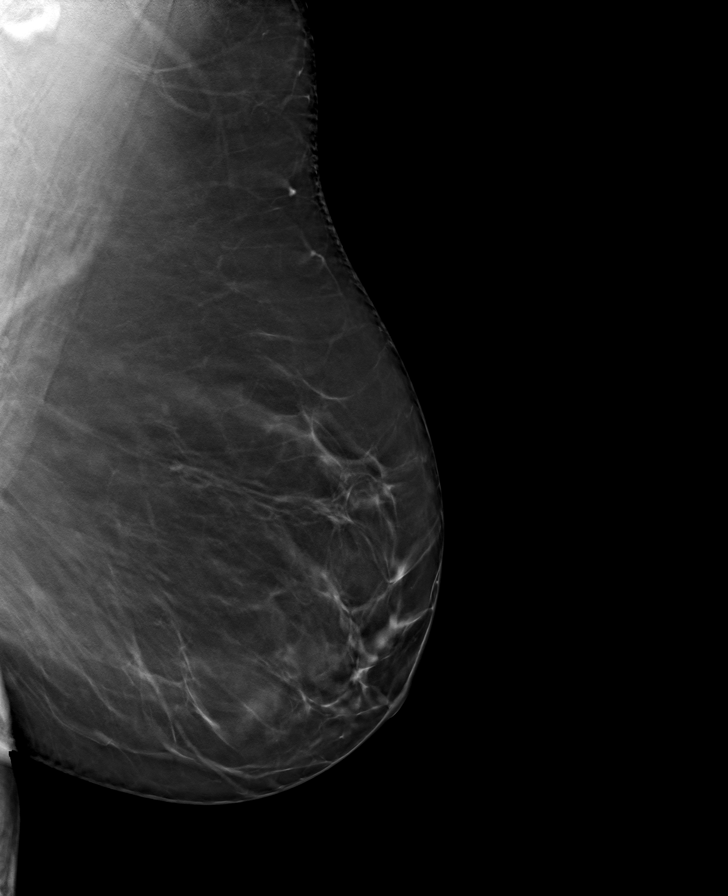

[R CC tomo · tomo slice 41/82.0]
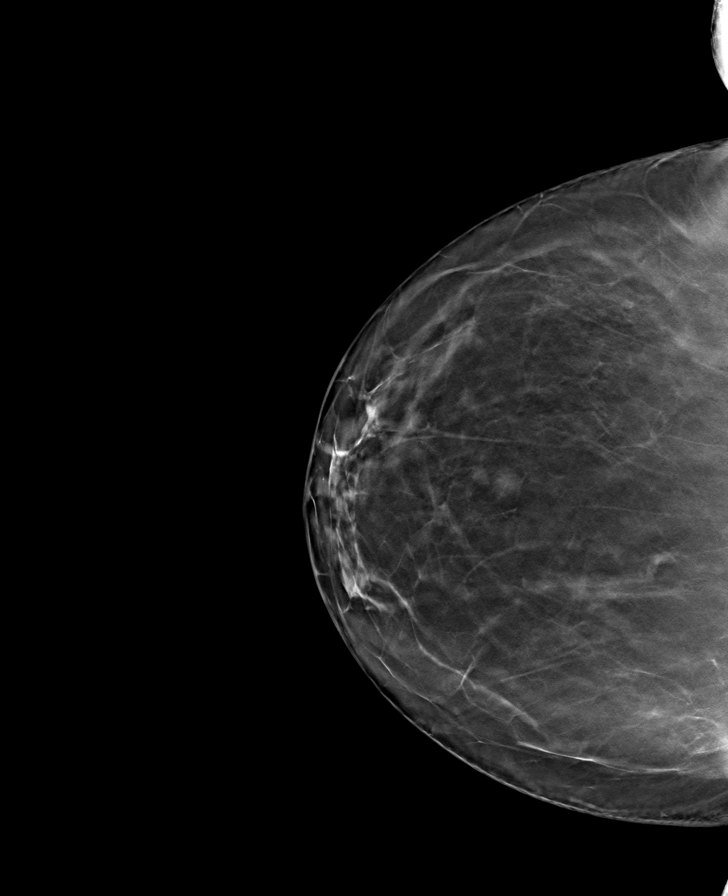

[L CC tomo · tomo slice 42/83.0]
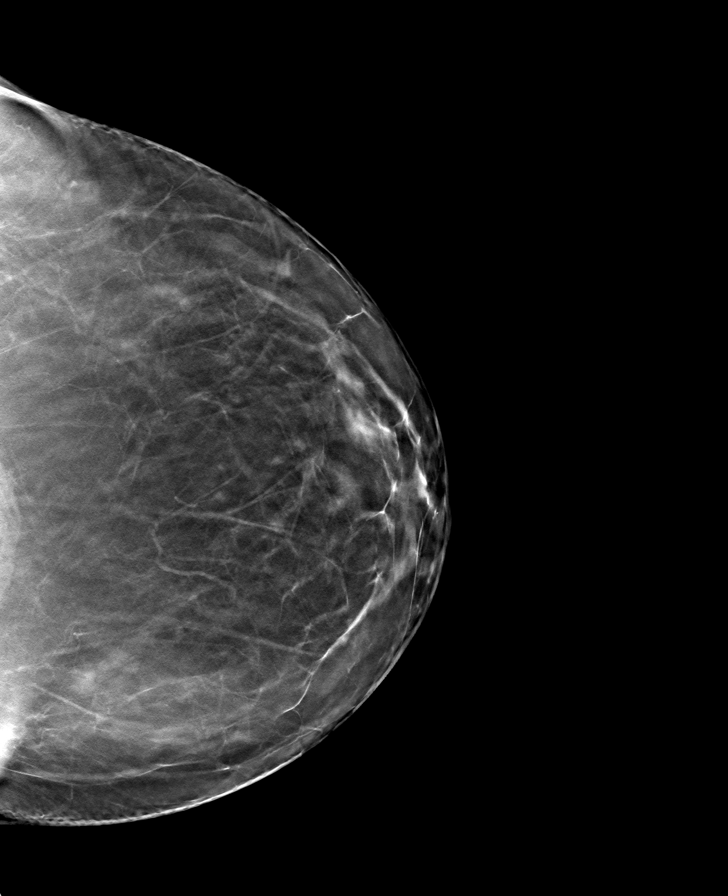

[8 of 24 positions shown; findings below may reference images not displayed]

ACR Breast Density Category b: There are scattered areas of
fibroglandular density.
FINDINGS: In the right breast, a possible mass warrants further evaluation.
RIGHT axillary lymph nodes are possibly enlarged. In the left
breast, no findings suspicious for malignancy.
IMPRESSION: Further evaluation is suggested for possible RIGHT breast mass and
possible RIGHT axillary adenopathy.

RECOMMENDATION:
Diagnostic mammogram and possibly ultrasound of the right breast.
(Code:6H-V-EES)

The patient will be contacted regarding the findings, and additional
imaging will be scheduled.

BI-RADS CATEGORY  0: Incomplete. Need additional imaging evaluation
and/or prior mammograms for comparison.

ADDENDUM:
Comparison is now made with multiple prior studies performed [DATE],
[DATE], [DATE], [DATE], and 6310 from [place]. The appearance of the
RIGHT breast and LOWER axilla show long-term stability, with no
further evaluation needed.

Recommendation:  Screening mammogram in one year.(Code:W6-4-88I)

BI-RADS: 1: Negative.

*** End of Addendum ***
ACR Breast Density Category b: There are scattered areas of
fibroglandular density.
FINDINGS: In the right breast, a possible mass warrants further evaluation.
RIGHT axillary lymph nodes are possibly enlarged. In the left
breast, no findings suspicious for malignancy.
IMPRESSION: Further evaluation is suggested for possible RIGHT breast mass and
possible RIGHT axillary adenopathy.

RECOMMENDATION:
Diagnostic mammogram and possibly ultrasound of the right breast.
(Code:6H-V-EES)

The patient will be contacted regarding the findings, and additional
imaging will be scheduled.

BI-RADS CATEGORY  0: Incomplete. Need additional imaging evaluation
and/or prior mammograms for comparison.

## 2023-06-24 ENCOUNTER — Ambulatory Visit: Payer: 59 | Admitting: Family Medicine

## 2023-06-24 ENCOUNTER — Other Ambulatory Visit: Payer: Self-pay | Admitting: Family Medicine

## 2023-06-24 ENCOUNTER — Encounter: Payer: Self-pay | Admitting: Family Medicine

## 2023-06-24 ENCOUNTER — Ambulatory Visit (HOSPITAL_COMMUNITY)
Admission: RE | Admit: 2023-06-24 | Discharge: 2023-06-24 | Disposition: A | Discharge status: Home / Self Care | Payer: 59 | Source: Ambulatory Visit | Attending: Family Medicine

## 2023-06-24 VITALS — BP 139/81 | HR 64 | Ht 64.96 in | Wt 224.2 lb

## 2023-06-24 DIAGNOSIS — R0789 Other chest pain: Secondary | ICD-10-CM | POA: Diagnosis present

## 2023-06-24 DIAGNOSIS — R001 Bradycardia, unspecified: Secondary | ICD-10-CM | POA: Diagnosis not present

## 2023-06-24 DIAGNOSIS — M549 Dorsalgia, unspecified: Secondary | ICD-10-CM

## 2023-06-24 MED ORDER — DICLOFENAC SODIUM 1 % EX GEL
4.0000 g | CUTANEOUS | 1 refills | Status: DC | PRN
Start: 2023-06-24 — End: 2024-06-12

## 2023-06-24 NOTE — Progress Notes (Signed)
    SUBJECTIVE:   CHIEF COMPLAINT / HPI:   Left side chest wall pain- started about a month ago, around the same time when she started the meloxicam. Thought it could be related to the meloxicam so she stopped it but the pain persisted. She notes it is in her left chest daily, worse when she pushes on it or twists, she describes it as "just doesn't feel right, like its stiffness." Not worse with activity, not associated with shortness of breath, not pressure or burning. She gets winded when walking uphill but this is not new or worsening. No inciting trauma or event that started it. Sometimes feels like radiates around to her back, but not down her arm or up her jaw or to her abdomen. NO associated with exertion or eating. Can feel it when she is laying down. NO palpitations, no other associated symptoms. No breast discharge or masses appreciate or pain in other breast.  HCM Pap smear- last in 2021, referred to OBGYN, thinks had pap smear at that visti Flu/COVID vaccine- already received last month   OBJECTIVE:   BP 139/81   Pulse 64   Ht 5' 4.96" (1.65 m)   Wt 224 lb 3.2 oz (101.7 kg)   SpO2 100%   BMI 37.35 kg/m   General: A&O, NAD HEENT: No sign of trauma, EOM grossly intact Cardiac: RRR, no m/r/g, + tenderness to palpation of left chest wall around 10 o/clock of left breast, no skin discoloration or rash appreciated, no masses appreciated at this location Respiratory: CTAB, normal WOB, no w/c/r GI: Soft, NTTP, non-distended  Extremities: NTTP, no peripheral edema. Neuro: Normal gait, moves all four extremities appropriately. Psych: Appropriate mood and affect   EKG read and interpreted by myself in clinic: sinus bradycardia rate 58 bpm, no ST segment changes, no TWI, no ectopy  ASSESSMENT/PLAN:   Assessment & Plan Left-sided chest wall pain Reproducible on exam, present constantly for the past month, most consistent with MSK etiology such as costochondritis versus related to  breast pain EKG today in clinic interpreted by myself, sinus bradycardia, no ST segement changes or TWI or signs of ischemia INTERCHEST score of -1, recent risk stratification labs including lipid and A1c showing prediabetes only Due to pain along breast, rescheduled screening mammogram to diagnostic mammogram With shared decision making, due to age and atypical chest pain agree with referral to cardiology to consider stress testing, discussed return precautions including chest pain with exertion, radiation of chest pain, associated shortness of breath or diaphoresis Musculoskeletal back pain Refilled diclofenac gel, discussed not doing meloxicam while working up chest pain as above    Billey Co, MD [facility]

## 2023-06-24 NOTE — Patient Instructions (Signed)
It was wonderful to see you today.  Please bring ALL of your medications with you to every visit.   Today we talked about:  We are rescheduling your mammogrma.  You will get a call from the cardiologist, call me in two weeks if you don't.   Thank you for choosing [facility].   Please call [phone] with any questions about today's appointment.  Please arrive at least 15 minutes prior to your scheduled appointments.   If you had blood work today, I will send you a MyChart message or a letter if results are normal. Otherwise, I will give you a call.   If you had a referral placed, they will call you to set up an appointment. Please give [place] a call if you don't hear back in the next 2 weeks.   If you need additional refills before your next appointment, please call your pharmacy first.   Burley Saver, MD  Family Medicine

## 2023-06-26 DIAGNOSIS — Z860100 Personal history of colon polyps, unspecified: Secondary | ICD-10-CM | POA: Insufficient documentation

## 2023-06-28 ENCOUNTER — Ambulatory Visit: Payer: 59

## 2023-06-28 VITALS — BP 140/72 | HR 67 | Ht 65.5 in | Wt 225.6 lb

## 2023-06-28 DIAGNOSIS — R030 Elevated blood-pressure reading, without diagnosis of hypertension: Secondary | ICD-10-CM

## 2023-06-28 DIAGNOSIS — R0609 Other forms of dyspnea: Secondary | ICD-10-CM

## 2023-06-28 DIAGNOSIS — R079 Chest pain, unspecified: Secondary | ICD-10-CM | POA: Diagnosis not present

## 2023-06-28 DIAGNOSIS — I10 Essential (primary) hypertension: Secondary | ICD-10-CM | POA: Insufficient documentation

## 2023-06-28 NOTE — Progress Notes (Signed)
Cardiology Consultation:    Date:  06/28/2023   ID:  Kathleen Evans, DOB 1960/10/12, MRN [id number]  PCP:  Billey Co, MD  Cardiologist:  Marlyn Corporal Kemiyah Tarazon, MD   Referring MD: Billey Co, MD   Chief Complaint  Patient presents with   chest discomfort    Ongoing for 1 month     ASSESSMENT AND PLAN:   Ms. Kathleen Evans 62 year old woman with history of chronic low back pain, obesity, prediabetes presenting for atypical chest pain symptoms which appear mostly normal on cardiac in description.  Problem List Items Addressed This Visit     Dyspnea on exertion    Chronic, stable symptoms.  Likely deconditioning versus anginal equivalent versus uncontrolled blood pressure.  Will evaluate cardiac structure and function with transthoracic echocardiogram. Will obtain a treadmill stress test with nuclear imaging.  Advised to avoid moderate to heavy exertion until workup is completed.       Relevant Orders   EKG 12-Lead (Completed)   ECHOCARDIOGRAM COMPLETE   MYOCARDIAL PERFUSION IMAGING   Elevated blood pressure reading without diagnosis of hypertension    Reviewed target blood pressures ideally below 130 over 80 mmHg. Suggested to keep tabs on blood pressure readings at home using an upper arm cuff if possible and notify either PCP or our office if blood pressure readings are consistently above 130 over 80 mmHg.  She wishes to continue with aggressive lifestyle and dietary modifications to target weight loss and improve her blood pressure before embarking on any antihypertensive medications if she is diagnosed with hypertension.       Relevant Orders   ECHOCARDIOGRAM COMPLETE   Chest pain - Primary    Very atypical symptoms with the chest pain, appears noncardiac in description. She does have cardiovascular risk factors with her age, obesity, prediabetes.  Recommended aggressive risk factor modifications and keep close follow-up with PCP regarding weight  management, diabetes screening.      Return to clinic based on test results.    History of Present Illness:    Kathleen Evans is a 62 y.o. female who is being seen today for the evaluation of chest pain at the request of Pray, Milus Mallick, MD.  Here for the visit by herself.  Has history of chronic lower back pain, obesity, prediabetes.  No significant prior cardiac history.  She is retired lives at home with her husband.  No regular exercise.   Over the last 4 to 5 weeks she has been having symptoms describes as chest discomfort, left upper part of the chest radiating to the back, more or less constant, not associated with any obvious trigger or relieving factors.  Denies any orthopnea, paroxysmal nocturnal dyspnea, shortness of breath.  Does not limit her day-to-day activities.  She has not been doing anything too strenuous.  No change with position, breathing, food, time of the day.  Mentions her functional status has been more or less steady, she describes a sensation of shortness of breath when she tries to go up A flight of stairs but this has been chronic.  No significant weight gain or weight loss.  She does not track her blood pressures at home. Blood pressure here in the office today mildly elevated.  Does not smoke. Rare social alcohol consumption at most to drink. No recreational drug use.  Reports family history of younger brother in his 34s suffering a CVA.  EKG in the clinic today shows sinus rhythm with heart rate 67/min, normal PR  interval, sinus arrhythmia observed. Nonspecific T wave changes in anteroseptal leads.  Similar findings on prior EKG from June 24, 2023.  Blood work from December 21, 2022 notes hemoglobin A1c 6 Total cholesterol 139, HDL 45, LDL 81, triglycerides 63.  Past Medical History:  Diagnosis Date   Obesity (BMI 35.0-39.9 without comorbidity)     Past Surgical History:  Procedure Laterality Date   LAPAROSCOPIC VAGINAL HYSTERECTOMY  WITH SALPINGO OOPHORECTOMY  2005    Current Medications: Current Meds  Medication Sig   diclofenac Sodium (VOLTAREN) 1 % GEL Apply 4 g topically as needed (2 times daily as needed).   [DISCONTINUED] meloxicam (MOBIC) 7.5 MG tablet TAKE 1 TABLET BY MOUTH EVERY DAY     Allergies:   Patient has no known allergies.   Social History   Socioeconomic History   Marital status: Married    Spouse name: Not on file   Number of children: Not on file   Years of education: Not on file   Highest education level: Not on file  Occupational History   Not on file  Tobacco Use   Smoking status: Never   Smokeless tobacco: Never  Substance and Sexual Activity   Alcohol use: Never   Drug use: Never   Sexual activity: Not on file  Other Topics Concern   Not on file  Social History Narrative   Not on file   Social Determinants of Health   Financial Resource Strain: Not on file  Food Insecurity: Not on file  Transportation Needs: Not on file  Physical Activity: Not on file  Stress: Not on file  Social Connections: Not on file     Family History: The patient's family history includes Bladder Cancer in her mother; Hypertension in her father. There is no history of Breast cancer. ROS:   Please see the history of present illness.    All 14 point review of systems negative except as described per history of present illness.  EKGs/Labs/Other Studies Reviewed:    The following studies were reviewed today:   EKG:  EKG Interpretation Date/Time:  Friday June 28 2023 13:48:06 EDT Ventricular Rate:  67 PR Interval:  164 QRS Duration:  88 QT Interval:  398 QTC Calculation: 420 R Axis:   -17  Text Interpretation: Normal sinus rhythm with sinus arrhythmia Nonspecific T wave abnormality When compared with ECG of 24-Jun-2023 10:50, Nonspecific T wave abnormality, worse in Anterior leads Confirmed by Huntley Dec reddy [phone]) on 06/28/2023 2:28:25 PM    Recent Labs: 12/21/2022: BUN  17; Creatinine, Ser 0.81; Potassium 4.9; Sodium 142  Recent Lipid Panel    Component Value Date/Time   CHOL 139 12/21/2022 1723   TRIG 63 12/21/2022 1723   HDL 45 12/21/2022 1723   CHOLHDL 3.1 12/21/2022 1723   LDLCALC 81 12/21/2022 1723    Physical Exam:    VS:  BP (!) 140/72 (BP Location: Left Arm, Patient Position: Sitting)   Pulse 67   Ht 5' 5.5" (1.664 m)   Wt 225 lb 9.6 oz (102.3 kg)   SpO2 95%   BMI 36.97 kg/m     Wt Readings from Last 3 Encounters:  06/28/23 225 lb 9.6 oz (102.3 kg)  06/24/23 224 lb 3.2 oz (101.7 kg)  12/21/22 227 lb (103 kg)     GENERAL:  Well nourished, well developed in no acute distress NECK: No JVD; No carotid bruits CARDIAC: RRR, S1 and S2 present, no murmurs, no rubs, no gallops CHEST:  Clear to auscultation  without rales, wheezing or rhonchi  Extremities: No pitting pedal edema. Pulses bilaterally symmetric with radial 2+ and dorsalis pedis 2+ NEUROLOGIC:  Alert and oriented x 3  Medication Adjustments/Labs and Tests Ordered: Current medicines are reviewed at length with the patient today.  Concerns regarding medicines are outlined above.  Orders Placed This Encounter  Procedures   MYOCARDIAL PERFUSION IMAGING   EKG 12-Lead   ECHOCARDIOGRAM COMPLETE   No orders of the defined types were placed in this encounter.   Signed, Cecille Amsterdam, MD, MPH, [facility]. 06/28/2023 2:49 PM    [facility]

## 2023-06-28 NOTE — Assessment & Plan Note (Signed)
Reviewed target blood pressures ideally below 130 over 80 mmHg. Suggested to keep tabs on blood pressure readings at home using an upper arm cuff if possible and notify either PCP or our office if blood pressure readings are consistently above 130 over 80 mmHg.  She wishes to continue with aggressive lifestyle and dietary modifications to target weight loss and improve her blood pressure before embarking on any antihypertensive medications if she is diagnosed with hypertension.

## 2023-06-28 NOTE — Assessment & Plan Note (Signed)
Very atypical symptoms with the chest pain, appears noncardiac in description. She does have cardiovascular risk factors with her age, obesity, prediabetes.  Recommended aggressive risk factor modifications and keep close follow-up with PCP regarding weight management, diabetes screening.

## 2023-06-28 NOTE — Assessment & Plan Note (Addendum)
Chronic, stable symptoms.  Likely deconditioning versus anginal equivalent versus uncontrolled blood pressure.  Will evaluate cardiac structure and function with transthoracic echocardiogram. Will obtain a treadmill stress test with nuclear imaging.  Advised to avoid moderate to heavy exertion until workup is completed.

## 2023-06-28 NOTE — Patient Instructions (Signed)
Medication Instructions:  Your physician recommends that you continue on your current medications as directed. Please refer to the Current Medication list given to you today.  *If you need a refill on your cardiac medications before your next appointment, please call your pharmacy*   Lab Work: None If you have labs (blood work) drawn today and your tests are completely normal, you will receive your results only by: MyChart Message (if you have MyChart) OR A paper copy in the mail If you have any lab test that is abnormal or we need to change your treatment, we will call you to review the results.   Testing/Procedures: Your physician has requested that you have an echocardiogram. Echocardiography is a painless test that uses sound waves to create images of your heart. It provides your doctor with information about the size and shape of your heart and how well your heart's chambers and valves are working. This procedure takes approximately one hour. There are no restrictions for this procedure. Please do NOT wear cologne, perfume, aftershave, or lotions (deodorant is allowed). Please arrive 15 minutes prior to your appointment time.    [facility] [address] [place], [place] [address] Phone:  [phone]    Please arrive 15 minutes prior to your appointment time for registration and insurance purposes.  The test will take approximately 3 to 4 hours to complete; you may bring reading material.  If someone comes with you to your appointment, they will need to remain in the main lobby due to limited space in the testing area. **If you are pregnant or breastfeeding, please notify the nuclear lab prior to your appointment**  How to prepare for your Myocardial Perfusion Test: Do not eat or drink 3 hours prior to your test, except you may have water. Do not consume products containing caffeine (regular or decaffeinated) 12 hours prior to your test. (ex: coffee,  chocolate, sodas, tea). Do bring a list of your current medications with you.  If not listed below, you may take your medications as normal. Do wear comfortable clothes (no dresses or overalls) and walking shoes, tennis shoes preferred (No heels or open toe shoes are allowed). Do NOT wear cologne, perfume, aftershave, or lotions (deodorant is allowed). If these instructions are not followed, your test will have to be rescheduled.  Please report to [address] for your test.  If you have questions or concerns about your appointment, you can call the [facility]  Nuclear Imaging Lab at [phone].  If you cannot keep your appointment, please provide 24 hours notification to the Nuclear Lab, to avoid a possible $50 charge to your account.    Follow-Up: At [facility], you and your health needs are our priority.  As part of our continuing mission to provide you with exceptional heart care, we have created designated Provider Care Teams.  These Care Teams include your primary Cardiologist (physician) and Advanced Practice Providers (APPs -  Physician Assistants and Nurse Practitioners) who all work together to provide you with the care you need, when you need it.  We recommend signing up for the patient portal called "MyChart".  Sign up information is provided on this After Visit Summary.  MyChart is used to connect with patients for Virtual Visits (Telemedicine).  Patients are able to view lab/test results, encounter notes, upcoming appointments, etc.  Non-urgent messages can be sent to your provider as well.   To learn more about what you can do with  MyChart, go to [email or web address].    Your next appointment:   Follow up to be determined after testing  Provider:   Huntley Dec, MD    Other Instructions None

## 2023-06-28 NOTE — Addendum Note (Signed)
Addended by: Roxanne Mins I on: 06/28/2023 04:24 PM   Modules accepted: Orders

## 2023-06-28 NOTE — Addendum Note (Signed)
Addended by: Huntley Dec REDDY on: 06/28/2023 04:51 PM   Modules accepted: Orders

## 2023-07-02 ENCOUNTER — Ambulatory Visit: Payer: 59

## 2023-07-02 DIAGNOSIS — R0609 Other forms of dyspnea: Secondary | ICD-10-CM

## 2023-07-02 MED ORDER — TECHNETIUM TC 99M TETROFOSMIN IV KIT
25.2000 | PACK | Freq: Once | INTRAVENOUS | Status: AC | PRN
  Administered 2023-07-02: 25.2 via INTRAVENOUS

## 2023-07-03 ENCOUNTER — Ambulatory Visit: Payer: 59

## 2023-07-03 MED ORDER — TECHNETIUM TC 99M TETROFOSMIN IV KIT
27.0000 | PACK | Freq: Once | INTRAVENOUS | Status: AC | PRN
  Administered 2023-07-03: 27 via INTRAVENOUS

## 2023-07-04 LAB — MYOCARDIAL PERFUSION IMAGING
Angina Index: 0
Duke Treadmill Score: 5
Estimated workload: 7
Exercise duration (min): 5 min
Exercise duration (sec): 1 s
LV dias vol: 83 mL (ref 46–106)
LV sys vol: 31 mL
MPHR: 159 {beats}/min
Nuc Stress EF: 63 %
Peak HR: 148 {beats}/min
Percent HR: 92 %
RPE: 19
Rest HR: 57 {beats}/min
Rest Nuclear Isotope Dose: 27 mCi
SDS: 0
SRS: 0
SSS: 0
ST Depression (mm): 0 mm
Stress Nuclear Isotope Dose: 25.2 mCi
TID: 0.8

## 2023-07-15 ENCOUNTER — Ambulatory Visit: Payer: 59

## 2023-07-17 ENCOUNTER — Ambulatory Visit: Payer: 59

## 2023-07-17 ENCOUNTER — Ambulatory Visit
Admission: RE | Admit: 2023-07-17 | Discharge: 2023-07-17 | Disposition: A | Discharge status: Home / Self Care | Payer: 59 | Source: Ambulatory Visit | Attending: Family Medicine

## 2023-07-17 DIAGNOSIS — R0789 Other chest pain: Secondary | ICD-10-CM

## 2023-07-25 ENCOUNTER — Other Ambulatory Visit: Payer: Self-pay | Admitting: Family Medicine

## 2023-07-25 DIAGNOSIS — Z1231 Encounter for screening mammogram for malignant neoplasm of breast: Secondary | ICD-10-CM

## 2023-08-01 ENCOUNTER — Encounter: Payer: Self-pay | Admitting: Family Medicine

## 2023-08-01 ENCOUNTER — Ambulatory Visit: Payer: 59

## 2023-08-01 DIAGNOSIS — R030 Elevated blood-pressure reading, without diagnosis of hypertension: Secondary | ICD-10-CM

## 2023-08-01 DIAGNOSIS — R0609 Other forms of dyspnea: Secondary | ICD-10-CM

## 2023-08-01 LAB — ECHOCARDIOGRAM COMPLETE
Area-P 1/2: 4.13 cm2
S' Lateral: 2.8 cm

## 2023-08-02 ENCOUNTER — Other Ambulatory Visit: Payer: Self-pay | Admitting: Family Medicine

## 2023-11-11 ENCOUNTER — Ambulatory Visit: Payer: 59 | Admitting: Family Medicine

## 2023-11-29 ENCOUNTER — Ambulatory Visit: Payer: 59 | Admitting: Family Medicine

## 2023-11-29 ENCOUNTER — Encounter: Payer: Self-pay | Admitting: Family Medicine

## 2023-11-29 VITALS — BP 159/76 | HR 68 | Wt 227.4 lb

## 2023-11-29 DIAGNOSIS — I10 Essential (primary) hypertension: Secondary | ICD-10-CM

## 2023-11-29 DIAGNOSIS — L659 Nonscarring hair loss, unspecified: Secondary | ICD-10-CM

## 2023-11-29 DIAGNOSIS — E559 Vitamin D deficiency, unspecified: Secondary | ICD-10-CM | POA: Diagnosis not present

## 2023-11-29 LAB — POCT GLYCOSYLATED HEMOGLOBIN (HGB A1C): Hemoglobin A1C: 5.8 % — AB (ref 4.0–5.6)

## 2023-11-29 MED ORDER — LOSARTAN POTASSIUM 25 MG PO TABS: 25.0000 mg | ORAL_TABLET | Freq: Every day | ORAL | 3 refills | Status: DC

## 2023-11-29 NOTE — Progress Notes (Signed)
    SUBJECTIVE:   CHIEF COMPLAINT / HPI:   Hair thinning- noticed at her dermatologist, they recommended her PCP check her thyroid. Has been for several months. Also some lines in her nails. No dry skin or constipation.   Elevated blood pressure- had seen cardiology in October 2024 and recommended continuing to check BP. Is willing to start medication today.  Did discuss normal ECHO but thickened walls showing signs of high blood pressure.  Vitamin D insufficiency- has lab work from dermatologist with Vit D level 29.4 (scanned in media tab). Has purchased Vit D, unsure dose, and wondering what dose to take.  Due for pap smear- notes she gets with her OB GYN in May    OBJECTIVE:   BP (!) 159/76   Pulse 68   Wt 227 lb 6.4 oz (103.1 kg)   SpO2 99%   BMI 37.27 kg/m   General: A&O, NAD HEENT: No sign of trauma, EOM grossly intact Cardiac: RRR, no m/r/g Respiratory: CTAB, normal WOB, no w/c/r GI: Soft, NTTP, non-distended  Extremities: NTTP, no peripheral edema. Neuro: Normal gait, moves all four extremities appropriately. Psych: Appropriate mood and affect   ASSESSMENT/PLAN:   Assessment & Plan Essential hypertension Will check lab work today and start losartan 25 mg, recommended checking BP at home and sending mychart log in 2 weeks, recommend lab follow up in 2 weeks to repeat BMP Hair thinning Checking TSH Vitamin D insufficiency Recommend 600-800 international units daily, and calcium 1200 mg daily     Billey Co, MD [facility]

## 2023-11-29 NOTE — Patient Instructions (Addendum)
 It was wonderful to see you today.  Please bring ALL of your medications with you to every visit.   Today we talked about:  You have mildly low vitamin D, you can take 600 to 800 international units of vitamin D per day.    You can also take calcium 1200 mg daily.   We will start losartan 25 mg daily for your blood pressure.   Make a lab visit appt in 2 weeks.  Thank you for choosing [facility].   Please call [phone] with any questions about today's appointment.  Please arrive at least 15 minutes prior to your scheduled appointments.   If you had blood work today, I will send you a MyChart message or a letter if results are normal. Otherwise, I will give you a call.   If you had a referral placed, they will call you to set up an appointment. Please give [place] a call if you don't hear back in the next 2 weeks.   If you need additional refills before your next appointment, please call your pharmacy first.   Burley Saver, MD  Family Medicine

## 2023-11-29 NOTE — Assessment & Plan Note (Signed)
 Will check lab work today and start losartan 25 mg, recommended checking BP at home and sending mychart log in 2 weeks, recommend lab follow up in 2 weeks to repeat BMP

## 2023-11-30 LAB — BASIC METABOLIC PANEL
BUN/Creatinine Ratio: 22 (ref 12–28)
BUN: 18 mg/dL (ref 8–27)
CO2: 21 mmol/L (ref 20–29)
Calcium: 9.6 mg/dL (ref 8.7–10.3)
Chloride: 105 mmol/L (ref 96–106)
Creatinine, Ser: 0.82 mg/dL (ref 0.57–1.00)
Glucose: 86 mg/dL (ref 70–99)
Potassium: 5.2 mmol/L (ref 3.5–5.2)
Sodium: 142 mmol/L (ref 134–144)
eGFR: 81 mL/min/{1.73_m2} (ref >59)

## 2023-11-30 LAB — LIPID PANEL
Chol/HDL Ratio: 3.6 ratio (ref 0.0–4.4)
Cholesterol, Total: 164 mg/dL (ref 100–199)
HDL: 46 mg/dL (ref >39)
LDL Chol Calc (NIH): 102 mg/dL — ABNORMAL HIGH (ref 0–99)
Triglycerides: 87 mg/dL (ref 0–149)
VLDL Cholesterol Cal: 16 mg/dL (ref 5–40)

## 2023-11-30 LAB — TSH RFX ON ABNORMAL TO FREE T4: TSH: 3.03 u[IU]/mL (ref 0.450–4.500)

## 2023-12-12 ENCOUNTER — Other Ambulatory Visit

## 2023-12-12 DIAGNOSIS — I10 Essential (primary) hypertension: Secondary | ICD-10-CM

## 2023-12-13 ENCOUNTER — Encounter: Payer: Self-pay | Admitting: Family Medicine

## 2023-12-13 LAB — BASIC METABOLIC PANEL WITH GFR
BUN/Creatinine Ratio: 19 (ref 12–28)
BUN: 16 mg/dL (ref 8–27)
CO2: 22 mmol/L (ref 20–29)
Calcium: 9.3 mg/dL (ref 8.7–10.3)
Chloride: 106 mmol/L (ref 96–106)
Creatinine, Ser: 0.85 mg/dL (ref 0.57–1.00)
Glucose: 85 mg/dL (ref 70–99)
Potassium: 5.2 mmol/L (ref 3.5–5.2)
Sodium: 141 mmol/L (ref 134–144)
eGFR: 77 mL/min/{1.73_m2} (ref >59)

## 2024-02-18 ENCOUNTER — Other Ambulatory Visit: Payer: Self-pay | Admitting: Family Medicine

## 2024-02-18 DIAGNOSIS — I10 Essential (primary) hypertension: Secondary | ICD-10-CM

## 2024-06-12 ENCOUNTER — Ambulatory Visit (INDEPENDENT_AMBULATORY_CARE_PROVIDER_SITE_OTHER)

## 2024-06-12 ENCOUNTER — Ambulatory Visit: Admitting: Podiatry

## 2024-06-12 DIAGNOSIS — M7752 Other enthesopathy of left foot: Secondary | ICD-10-CM

## 2024-06-12 DIAGNOSIS — M7751 Other enthesopathy of right foot: Secondary | ICD-10-CM | POA: Diagnosis not present

## 2024-06-12 DIAGNOSIS — S90822A Blister (nonthermal), left foot, initial encounter: Secondary | ICD-10-CM

## 2024-06-12 DIAGNOSIS — M2141 Flat foot [pes planus] (acquired), right foot: Secondary | ICD-10-CM

## 2024-06-12 NOTE — Progress Notes (Signed)
  Chief Complaint  Patient presents with   Foot Pain    Bilateral separating with standing and walking on of the hallux and the 2nd toe. No pain, just wants to make sure it is normal and ok.  Not diabetic and no anti coag.    HPI: 63 y.o. female presents today with concern of splaying of the right 2nd and 3rd toes when she stands.  She is not having any pain but she was concerned if there is some other issue causing this.  She also has a blister on the back of her left heel from mowing her yard with a push mower.  Typically they use a riding mower but it needs repaired, so she did the complete yard with the push mower and subsequently developed the blister.  Past Medical History:  Diagnosis Date   Obesity (BMI 35.0-39.9 without comorbidity)    Past Surgical History:  Procedure Laterality Date   LAPAROSCOPIC VAGINAL HYSTERECTOMY WITH SALPINGO OOPHORECTOMY  2005   No Known Allergies   Physical Exam: Palpable pedal pulses.  There is mild splaying of the right 2nd and 3rd toes at the level of the MPJ on weightbearing.  No pain on palpation to the metatarsal phalangeal joints with PIP joints.  No pain to the plantar fascia.  No swelling is noted.  There is a recently drained blister, superficial, on the posterior aspect of the left heel.  No clinical signs of infection are noted.  This is approximately the size of a nickel.  Epicritic sensation intact  Radiographic Exam (bilateral foot, 3 weightbearing views, 06/12/2024):  Normal osseous mineralization. No fractures noted.  Splaying of the 2nd and 3rd toes noted at the level of the MPJ.  Calcaneal spur noted  Assessment/Plan of Care: 1. Acquired right splayfoot   2. Capsulitis of metatarsophalangeal (MTP) joints of both feet   3. Blister of left foot, initial encounter     Discussed findings with the patient.  We did discuss possible splinting of the toes to decrease worsening of the splaying toe deformity.  Since she is asymptomatic I do not  recommend any surgical intervention.  We did discuss possible conditions that can cause this but I do not feel that the patient is at risk for any of these conditions at this time based on clinical exam.  Antibiotic ointment and a large Band-Aid were applied to the blistered area on the posterior aspect of the left heel.  She will leave this on the rest of today.  She can perform Epsom salts at home and use antibiotic ointment to keep it clean until it heals and dries.  Follow-up as needed   Awanda CHARM Imperial, DPM, FACFAS [facility]     [address] [place], [place] [address]                Office [phone]  Fax [phone]

## 2024-06-18 ENCOUNTER — Ambulatory Visit: Payer: Self-pay | Admitting: Family Medicine

## 2024-06-18 ENCOUNTER — Other Ambulatory Visit: Payer: Self-pay | Admitting: Family Medicine

## 2024-06-18 ENCOUNTER — Other Ambulatory Visit

## 2024-06-18 DIAGNOSIS — R7303 Prediabetes: Secondary | ICD-10-CM | POA: Diagnosis not present

## 2024-06-18 LAB — POCT GLYCOSYLATED HEMOGLOBIN (HGB A1C): Hemoglobin A1C: 5.2 % (ref 4.0–5.6)

## 2024-06-18 NOTE — Progress Notes (Signed)
 Future order A1c ordered.

## 2024-06-23 ENCOUNTER — Ambulatory Visit

## 2024-06-23 DIAGNOSIS — Z23 Encounter for immunization: Secondary | ICD-10-CM

## 2024-06-23 NOTE — Progress Notes (Addendum)
 Patient presents to nurse clinic for Prevnar 20 vaccine.  Vaccine administered without complication.  See admin for details.

## 2024-07-16 ENCOUNTER — Ambulatory Visit
Admission: RE | Admit: 2024-07-16 | Discharge: 2024-07-16 | Disposition: A | Payer: 59 | Source: Ambulatory Visit | Attending: Family Medicine

## 2024-07-16 DIAGNOSIS — Z1231 Encounter for screening mammogram for malignant neoplasm of breast: Secondary | ICD-10-CM

## 2024-07-20 ENCOUNTER — Ambulatory Visit: Payer: Self-pay | Admitting: Family Medicine

## 2024-07-23 ENCOUNTER — Other Ambulatory Visit: Payer: Self-pay | Admitting: Family Medicine

## 2024-07-23 DIAGNOSIS — Z1231 Encounter for screening mammogram for malignant neoplasm of breast: Secondary | ICD-10-CM

## 2024-08-11 ENCOUNTER — Other Ambulatory Visit: Payer: Self-pay | Admitting: Family Medicine

## 2024-08-11 DIAGNOSIS — I10 Essential (primary) hypertension: Secondary | ICD-10-CM

## 2024-09-25 ENCOUNTER — Encounter: Payer: Self-pay | Admitting: Family Medicine

## 2024-10-12 ENCOUNTER — Ambulatory Visit: Admitting: Family Medicine

## 2024-10-19 ENCOUNTER — Ambulatory Visit: Admitting: Family Medicine

## 2025-07-19 ENCOUNTER — Ambulatory Visit
